# Patient Record
Sex: Female | Born: 1975 | Race: White | Hispanic: No | Marital: Single | Smoking: Never smoker
Health system: Southern US, Community
[De-identification: ages and names within clinical notes are randomized; demographics above are authoritative.]

## PROBLEM LIST (undated history)

## (undated) DIAGNOSIS — Z789 Other specified health status: Secondary | ICD-10-CM

## (undated) HISTORY — PX: DILATION AND CURETTAGE OF UTERUS: SHX78

## (undated) HISTORY — PX: WISDOM TOOTH EXTRACTION: SHX21

## (undated) HISTORY — DX: Other specified health status: Z78.9

---

## 2007-06-23 ENCOUNTER — Ambulatory Visit (HOSPITAL_COMMUNITY): Admission: RE | Admit: 2007-06-23 | Discharge: 2007-06-23 | Payer: Self-pay | Admitting: *Deleted

## 2016-05-12 ENCOUNTER — Encounter (HOSPITAL_COMMUNITY): Payer: Self-pay | Admitting: *Deleted

## 2016-05-12 ENCOUNTER — Emergency Department (HOSPITAL_COMMUNITY): Payer: BLUE CROSS/BLUE SHIELD

## 2016-05-12 ENCOUNTER — Emergency Department (HOSPITAL_COMMUNITY)
Admission: EM | Admit: 2016-05-12 | Discharge: 2016-05-13 | Disposition: A | Discharge status: Home / Self Care | Payer: BLUE CROSS/BLUE SHIELD | Attending: Emergency Medicine | Admitting: Emergency Medicine

## 2016-05-12 DIAGNOSIS — M545 Low back pain, unspecified: Secondary | ICD-10-CM

## 2016-05-12 DIAGNOSIS — K5900 Constipation, unspecified: Secondary | ICD-10-CM | POA: Diagnosis present

## 2016-05-12 LAB — CBC WITH DIFFERENTIAL/PLATELET
BASOS PCT: 1 %
Basophils Absolute: 0 10*3/uL (ref 0.0–0.1)
EOS PCT: 4 %
Eosinophils Absolute: 0.3 10*3/uL (ref 0.0–0.7)
HEMATOCRIT: 37.7 % (ref 36.0–46.0)
Hemoglobin: 13.3 g/dL (ref 12.0–15.0)
Lymphocytes Relative: 33 %
Lymphs Abs: 1.9 10*3/uL (ref 0.7–4.0)
MCH: 32 pg (ref 26.0–34.0)
MCHC: 35.3 g/dL (ref 30.0–36.0)
MCV: 90.6 fL (ref 78.0–100.0)
MONO ABS: 0.6 10*3/uL (ref 0.1–1.0)
MONOS PCT: 10 %
NEUTROS ABS: 3.1 10*3/uL (ref 1.7–7.7)
Neutrophils Relative %: 52 %
PLATELETS: 137 10*3/uL — AB (ref 150–400)
RBC: 4.16 MIL/uL (ref 3.87–5.11)
RDW: 12.3 % (ref 11.5–15.5)
WBC: 5.9 10*3/uL (ref 4.0–10.5)

## 2016-05-12 NOTE — ED Triage Notes (Signed)
Pt reports having rectal pain for "months." Pt states she has been constipated for a month and has used several enemas and when she uses them she states bright red blood comes out with a small amount of stool. Pt also reports "itching" around her rectum.

## 2016-05-12 NOTE — ED Provider Notes (Signed)
[facility] DEPT Provider Note   CSN: [id number] Arrival date & time: 05/12/16  2230  By signing my name below, I, Modena Jansky, attest that this documentation has been prepared under the direction and in the presence of Gilda Crease, MD. Electronically Signed: Modena Jansky, Scribe. 05/12/2016. 11:09 PM.  History   Chief Complaint Chief Complaint  Patient presents with  . Constipation   The history is provided by the patient and the spouse. No language interpreter was used.   HPI Comments: Janet Bray is a 41 y.o. female who presents to the Emergency Department complaining of constipation that started about a month ago. She has been using enemas without any relief and had rectal bleeding shortly after one dose. No other episodes of bleeding. She reports associated lower back pain, rectal pain with itching, and abdominal distension. She describes the pain as constant, moderate, and exacerbated by movement. She has tried diet changes in the past with relief. She denies any other complaints.     PCP: [facility]  History reviewed. No pertinent past medical history.  There are no active problems to display for this patient.   Past Surgical History:  Procedure Laterality Date  . CESAREAN SECTION    . DILATION AND CURETTAGE OF UTERUS    . WISDOM TOOTH EXTRACTION      OB History    No data available       Home Medications    Prior to Admission medications   Medication Sig Start Date End Date Taking? Authorizing Provider  fluconazole (DIFLUCAN) 150 MG tablet Take 150 mg by mouth daily.   Yes Historical Provider, MD  cyclobenzaprine (FLEXERIL) 10 MG tablet Take 1 tablet (10 mg total) by mouth 3 (three) times daily as needed for muscle spasms. 05/13/16   Gilda Crease, MD  polyethylene glycol (MIRALAX / GLYCOLAX) packet Take 17 g by mouth 2 (two) times daily. 05/13/16   Gilda Crease, MD    Family History History reviewed. No  pertinent family history.  Social History Social History  Substance Use Topics  . Smoking status: Never Smoker  . Smokeless tobacco: Never Used  . Alcohol use No     Allergies   Ciprofloxacin   Review of Systems Review of Systems  Gastrointestinal: Positive for abdominal distention, blood in stool, constipation and rectal pain. Anal bleeding: only after enema.  Musculoskeletal: Positive for back pain.  All other systems reviewed and are negative.    Physical Exam Updated Vital Signs BP 120/67 (BP Location: Right Arm)   Pulse 79   Temp 98.9 F (37.2 C) (Oral)   Resp 18   Ht 5\' 1"  (1.549 m)   Wt 104 lb (47.2 kg)   LMP 04/12/2016   SpO2 100%   BMI 19.65 kg/m   Physical Exam  Constitutional: She is oriented to person, place, and time. She appears well-developed and well-nourished. No distress.  HENT:  Head: Normocephalic and atraumatic.  Right Ear: Hearing normal.  Left Ear: Hearing normal.  Nose: Nose normal.  Mouth/Throat: Oropharynx is clear and moist and mucous membranes are normal.  Eyes: Conjunctivae and EOM are normal. Pupils are equal, round, and reactive to light.  Neck: Normal range of motion. Neck supple.  Cardiovascular: Regular rhythm, S1 normal and S2 normal.  Exam reveals no gallop and no friction rub.   No murmur heard. Pulmonary/Chest: Effort normal and breath sounds normal. No respiratory distress. She exhibits no tenderness.  Abdominal: Soft. Normal appearance and bowel  sounds are normal. There is no hepatosplenomegaly. There is no tenderness. There is no rebound, no guarding, no tenderness at McBurney's point and negative Murphy's sign. No hernia.  Musculoskeletal: Normal range of motion.  Neurological: She is alert and oriented to person, place, and time. She has normal strength. No cranial nerve deficit or sensory deficit. Coordination normal. GCS eye subscore is 4. GCS verbal subscore is 5. GCS motor subscore is 6.  Skin: Skin is warm, dry and  intact. No rash noted. No cyanosis.  Psychiatric: She has a normal mood and affect. Her speech is normal and behavior is normal. Thought content normal.  Nursing note and vitals reviewed.    ED Treatments / Results  DIAGNOSTIC STUDIES: Oxygen Saturation is 100% on RA, normal by my interpretation.    COORDINATION OF CARE: 11:13 PM- Pt advised of plan for treatment and pt agrees.  Labs (all labs ordered are listed, but only abnormal results are displayed) Labs Reviewed  CBC WITH DIFFERENTIAL/PLATELET - Abnormal; Notable for the following:       Result Value   Platelets 137 (*)    All other components within normal limits  COMPREHENSIVE METABOLIC PANEL - Abnormal; Notable for the following:    Glucose, Bld 108 (*)    All other components within normal limits  URINALYSIS, ROUTINE W REFLEX MICROSCOPIC - Abnormal; Notable for the following:    Hgb urine dipstick SMALL (*)    Squamous Epithelial / LPF 0-5 (*)    All other components within normal limits  LIPASE, BLOOD  POC URINE PREG, ED    EKG  EKG Interpretation None       Radiology Dg Abd Acute W/chest  Result Date: 05/13/2016 CLINICAL DATA:  Acute onset of lower back pain and rectal pain. Abdominal distention. Initial encounter. EXAM: DG ABDOMEN ACUTE W/ 1V CHEST COMPARISON:  None. FINDINGS: The lungs are hyperexpanded. There is no evidence of focal opacification, pleural effusion or pneumothorax. The cardiomediastinal silhouette is within normal limits. The visualized bowel gas pattern is unremarkable. Scattered stool and air are seen within the colon; there is no evidence of small bowel dilatation to suggest obstruction. No free intra-abdominal air is identified on the provided upright view. No acute osseous abnormalities are seen; the sacroiliac joints are unremarkable in appearance. IMPRESSION: 1. Unremarkable bowel gas pattern; no free intra-abdominal air seen. Moderate amount of stool noted in the colon, raising question  for mild constipation. 2. Lungs hyperexpanded but grossly clear. Electronically Signed   By: Roanna Raider M.D.   On: 05/13/2016 00:39    Procedures Procedures (including critical care time)  Medications Ordered in ED Medications - No data to display   Initial Impression / Assessment and Plan / ED Course  I have reviewed the triage vital signs and the nursing notes.  Pertinent labs & imaging results that were available during my care of the patient were reviewed by me and considered in my medical decision making (see chart for details).     Patient presents to the emergency department with complaints of back pain and constipation. She reports that she has not been having normal bowel movements for several weeks. Over this period of time she has also noticed pain in the lower back. She has attributed the back pain to constipation. Pain is related to movement. She has noticed bending, twisting, squatting down causes the pain to significantly worsened. There is no injury. She has not noticed any radiation of the pain to the lower extremities. No  saddle anesthesia, urinary symptoms. Normal strength and sensation on examination. Patient has a benign, nontender abdominal exam. Vital signs are normal. Blood work is entirely unremarkable. Urinalysis unremarkable. Abdominal x-ray does suggest constipation, no evidence of obstruction or other abnormality. Patient reassured, will treat for musculoskeletal back pain as well as constipation, likely 2 separate issues. Follow-up with primary care doctor.  Final Clinical Impressions(s) / ED Diagnoses   Final diagnoses:  Constipation, unspecified constipation type  Acute bilateral low back pain without sciatica    New Prescriptions New Prescriptions   CYCLOBENZAPRINE (FLEXERIL) 10 MG TABLET    Take 1 tablet (10 mg total) by mouth 3 (three) times daily as needed for muscle spasms.   POLYETHYLENE GLYCOL (MIRALAX / GLYCOLAX) PACKET    Take 17 g by mouth 2  (two) times daily.   I personally performed the services described in this documentation, which was scribed in my presence. The recorded information has been reviewed and is accurate.     Gilda Creasehristopher J Lam Bjorklund, MD 05/13/16 [id number]

## 2016-05-13 LAB — COMPREHENSIVE METABOLIC PANEL
ALBUMIN: 4.5 g/dL (ref 3.5–5.0)
ALK PHOS: 38 U/L (ref 38–126)
ALT: 14 U/L (ref 14–54)
ANION GAP: 7 (ref 5–15)
AST: 17 U/L (ref 15–41)
BILIRUBIN TOTAL: 0.4 mg/dL (ref 0.3–1.2)
BUN: 13 mg/dL (ref 6–20)
CALCIUM: 9 mg/dL (ref 8.9–10.3)
CO2: 26 mmol/L (ref 22–32)
CREATININE: 0.65 mg/dL (ref 0.44–1.00)
Chloride: 104 mmol/L (ref 101–111)
GFR calc Af Amer: >60 mL/min (ref >60)
GFR calc non Af Amer: >60 mL/min (ref >60)
GLUCOSE: 108 mg/dL — AB (ref 65–99)
Potassium: 3.8 mmol/L (ref 3.5–5.1)
SODIUM: 137 mmol/L (ref 135–145)
TOTAL PROTEIN: 7.6 g/dL (ref 6.5–8.1)

## 2016-05-13 LAB — URINALYSIS, ROUTINE W REFLEX MICROSCOPIC
BACTERIA UA: NONE SEEN
Bilirubin Urine: NEGATIVE
Glucose, UA: NEGATIVE mg/dL
Ketones, ur: NEGATIVE mg/dL
Leukocytes, UA: NEGATIVE
Nitrite: NEGATIVE
PROTEIN: NEGATIVE mg/dL
SPECIFIC GRAVITY, URINE: 1.015 (ref 1.005–1.030)
pH: 5 (ref 5.0–8.0)

## 2016-05-13 LAB — LIPASE, BLOOD: Lipase: 24 U/L (ref 11–51)

## 2016-05-13 LAB — POC URINE PREG, ED: PREG TEST UR: NEGATIVE

## 2016-05-13 MED ORDER — CYCLOBENZAPRINE HCL 10 MG PO TABS: 10.0000 mg | ORAL_TABLET | Freq: Three times a day (TID) | ORAL | 0 refills | Status: DC | PRN

## 2016-05-13 MED ORDER — MAGNESIUM CITRATE PO SOLN
1.0000 | Freq: Once | ORAL | Status: AC
  Administered 2016-05-13: 1 via ORAL
  Filled 2016-05-13: qty 296

## 2016-05-13 MED ORDER — POLYETHYLENE GLYCOL 3350 17 G PO PACK: 17.0000 g | PACK | Freq: Two times a day (BID) | ORAL | 0 refills | Status: DC

## 2016-05-20 ENCOUNTER — Encounter: Payer: Self-pay | Admitting: Gastroenterology

## 2016-06-17 ENCOUNTER — Encounter: Payer: Self-pay | Admitting: Gastroenterology

## 2016-06-17 ENCOUNTER — Ambulatory Visit (INDEPENDENT_AMBULATORY_CARE_PROVIDER_SITE_OTHER): Payer: BLUE CROSS/BLUE SHIELD | Admitting: Gastroenterology

## 2016-06-17 DIAGNOSIS — K5900 Constipation, unspecified: Secondary | ICD-10-CM | POA: Diagnosis not present

## 2016-06-17 LAB — CBC WITH DIFFERENTIAL/PLATELET
BASOS PCT: 1 %
Basophils Absolute: 34 cells/uL (ref 0–200)
EOS PCT: 4 %
Eosinophils Absolute: 136 cells/uL (ref 15–500)
HCT: 38.2 % (ref 35.0–45.0)
Hemoglobin: 12.8 g/dL (ref 11.7–15.5)
LYMPHS PCT: 28 %
Lymphs Abs: 952 cells/uL (ref 850–3900)
MCH: 31.3 pg (ref 27.0–33.0)
MCHC: 33.5 g/dL (ref 32.0–36.0)
MCV: 93.4 fL (ref 80.0–100.0)
MPV: 8.9 fL (ref 7.5–12.5)
Monocytes Absolute: 374 cells/uL (ref 200–950)
Monocytes Relative: 11 %
NEUTROS PCT: 56 %
Neutro Abs: 1904 cells/uL (ref 1500–7800)
PLATELETS: 134 10*3/uL — AB (ref 140–400)
RBC: 4.09 MIL/uL (ref 3.80–5.10)
RDW: 12.6 % (ref 11.0–15.0)
WBC: 3.4 10*3/uL — AB (ref 3.8–10.8)

## 2016-06-17 LAB — TSH: TSH: 1.45 m[IU]/L

## 2016-06-17 NOTE — Progress Notes (Addendum)
Primary Care Physician:  Toma Deiters, MD Primary Gastroenterologist:  Dr. Darrick Penna   Chief Complaint  Patient presents with  . Constipation    HPI:   Janet Bray is a 41 y.o. female presenting today at the request of Janet Bray ED secondary to constipation. Seen late March with normal Hgb, CMP, lipase. Abd xray with moderate amount of stool in colon. Platelets low at 137.   Had tinea versicolor and placed on a medication then started having constipation. Onset around February. Given medication in the ED that was liquid. Cleaned her completely out. Miralax is not helping at all. About every 4 days will have a BM. Lemon water helps her go only about every 4 days. Has used enema and saw blood on an occasion. No blood if going on her own. Has pain in lower back with significant constipation.   Past Medical History:  Diagnosis Date  . Medical history non-contributory     Past Surgical History:  Procedure Laterality Date  . CESAREAN SECTION    . DILATION AND CURETTAGE OF UTERUS    . WISDOM TOOTH EXTRACTION      Current Outpatient Prescriptions  Medication Sig Dispense Refill  . doxycycline (VIBRA-TABS) 100 MG tablet Take 100 mg by mouth daily. with food  1   No current facility-administered medications for this visit.     Allergies as of 06/17/2016 - Review Complete 06/17/2016  Allergen Reaction Noted  . Ciprofloxacin  05/12/2016    Family History  Problem Relation Age of Onset  . Family history unknown: Yes    Social History   Social History  . Marital status: Single    Spouse name: N/A  . Number of children: N/A  . Years of education: N/A   Occupational History  . homemaker    Social History Main Topics  . Smoking status: Never Smoker  . Smokeless tobacco: Never Used  . Alcohol use No  . Drug use: No  . Sexual activity: Not on file   Other Topics Concern  . Not on file   Social History Narrative   2 kids, 17 and 74,     Review of  Systems: Gen: Denies any fever, chills, fatigue, weight loss, lack of appetite.  CV: Denies chest pain, heart palpitations, peripheral edema, syncope.  Resp: Denies shortness of breath at rest or with exertion. Denies wheezing or cough.  GI: see HPI  GU : Denies urinary burning, urinary frequency, urinary hesitancy MS: Denies joint pain, muscle weakness, cramps, or limitation of movement.  Derm: Denies rash, itching, dry skin Psych: Denies depression, anxiety, memory loss, and confusion Heme: see HPI   Physical Exam: BP (!) 98/54   Pulse 76   Temp 97.3 F (36.3 C) (Oral)   Ht  (1.549 m)   Wt 103 lb (46.7 kg)   LMP 06/17/2016   BMI 19.46 kg/m  General:   Alert and oriented. Pleasant and cooperative. Well-nourished and well-developed.  Head:  Normocephalic and atraumatic. Eyes:  Without icterus, sclera clear and conjunctiva pink.  Ears:  Normal auditory acuity. Nose:  No deformity, discharge,  or lesions. Mouth:  No deformity or lesions, oral mucosa pink.  Lungs:  Clear to auscultation bilaterally. No wheezes, rales, or rhonchi. No distress.  Heart:  S1, S2 present without murmurs appreciated.  Abdomen:  +BS, soft, non-tender and non-distended. No HSM noted. No guarding or rebound. No masses appreciated.  Rectal:  Deferred  Msk:  Symmetrical without gross deformities.  Normal posture. Extremities:  Without  edema. Neurologic:  Alert and  oriented x4 Psych:  Alert and cooperative. Normal mood and affect.  Lab Results  Component Value Date   WBC 5.9 05/12/2016   HGB 13.3 05/12/2016   HCT 37.7 05/12/2016   MCV 90.6 05/12/2016   PLT 137 (L) 05/12/2016   Lab Results  Component Value Date   ALT 14 05/12/2016   AST 17 05/12/2016   ALKPHOS 38 05/12/2016   BILITOT 0.4 05/12/2016

## 2016-06-17 NOTE — Assessment & Plan Note (Addendum)
41 year old female with constipation, failing OTC agents. Mild scant hematochezia with enema, likely related to anorectal trauma. Otherwise, no alarm features. Trial Linzess 145 mcg once each morning, 30 minutes before breakfast. Check TSH. As of note, I see she has mild thrombocytopenia. We will recheck this today. Return in 6 weeks. If further bleeding, proceed with colonoscopy.

## 2016-06-17 NOTE — Progress Notes (Signed)
CC'ED TO PCP 

## 2016-06-17 NOTE — Patient Instructions (Signed)
For constipation: Start taking Linzess one capsule each morning, 30 minutes before breakfast. This can cause loose stool at first but continue taking for at least 5 days. If we need to adjust the dose higher or lower, we can.  Please have blood work today.  We will see you in 6 weeks!

## 2016-06-22 NOTE — Progress Notes (Signed)
TSH is normal. Her platelets remain mildly low. Is she still on doxycycline? This can cause thrombocytopenia. Whomever prescribed needs to be aware.

## 2016-06-22 NOTE — Progress Notes (Signed)
Pt is aware. Dr. Orvan FalconerBeavers, dermatologist, has her on the Doxycycline for Rosacea. She has been on it since 05/13/2016. She sees him again on 07/03/2016. I am mailing her this note and she will take to him to discuss at her appt.

## 2016-06-25 ENCOUNTER — Telehealth: Payer: Self-pay | Admitting: Gastroenterology

## 2016-06-25 NOTE — Telephone Encounter (Signed)
LMOM to call.

## 2016-06-25 NOTE — Telephone Encounter (Signed)
Pt was calling to give the nurse an update on the Linzess samples. She said that the dose she was taking wasn't helping and didn't know if she should continue or if we needed to change the dosage. Please advise and call her at [phone]

## 2016-06-25 NOTE — Telephone Encounter (Signed)
PT said the Linzess 145 mcg is not working, just giving her gas, but no results. Please advise!

## 2016-06-26 NOTE — Telephone Encounter (Signed)
Give samples of Linzess 290 mcg. Thanks! Trial of this, then call with update.

## 2016-06-29 NOTE — Telephone Encounter (Signed)
Pt is aware and samples at front for pick up, #8 Linzess 290 mcg.

## 2016-07-29 ENCOUNTER — Ambulatory Visit: Payer: BLUE CROSS/BLUE SHIELD | Admitting: Gastroenterology

## 2016-10-02 NOTE — Progress Notes (Signed)
REVIEWED-NO ADDITIONAL RECOMMENDATIONS. 

## 2017-08-20 ENCOUNTER — Telehealth: Payer: Self-pay | Admitting: Gastroenterology

## 2018-04-26 NOTE — Telephone Encounter (Signed)
Opened in error

## 2018-06-27 IMAGING — DX DG ABDOMEN ACUTE W/ 1V CHEST
4 series · 4 of 4 positions shown · non-contrast
Comparison: None.

CLINICAL DATA: Acute onset of lower back pain and rectal pain.
Abdominal distention. Initial encounter.

EXAM:
DG ABDOMEN ACUTE W/ 1V CHEST

[chest pa]
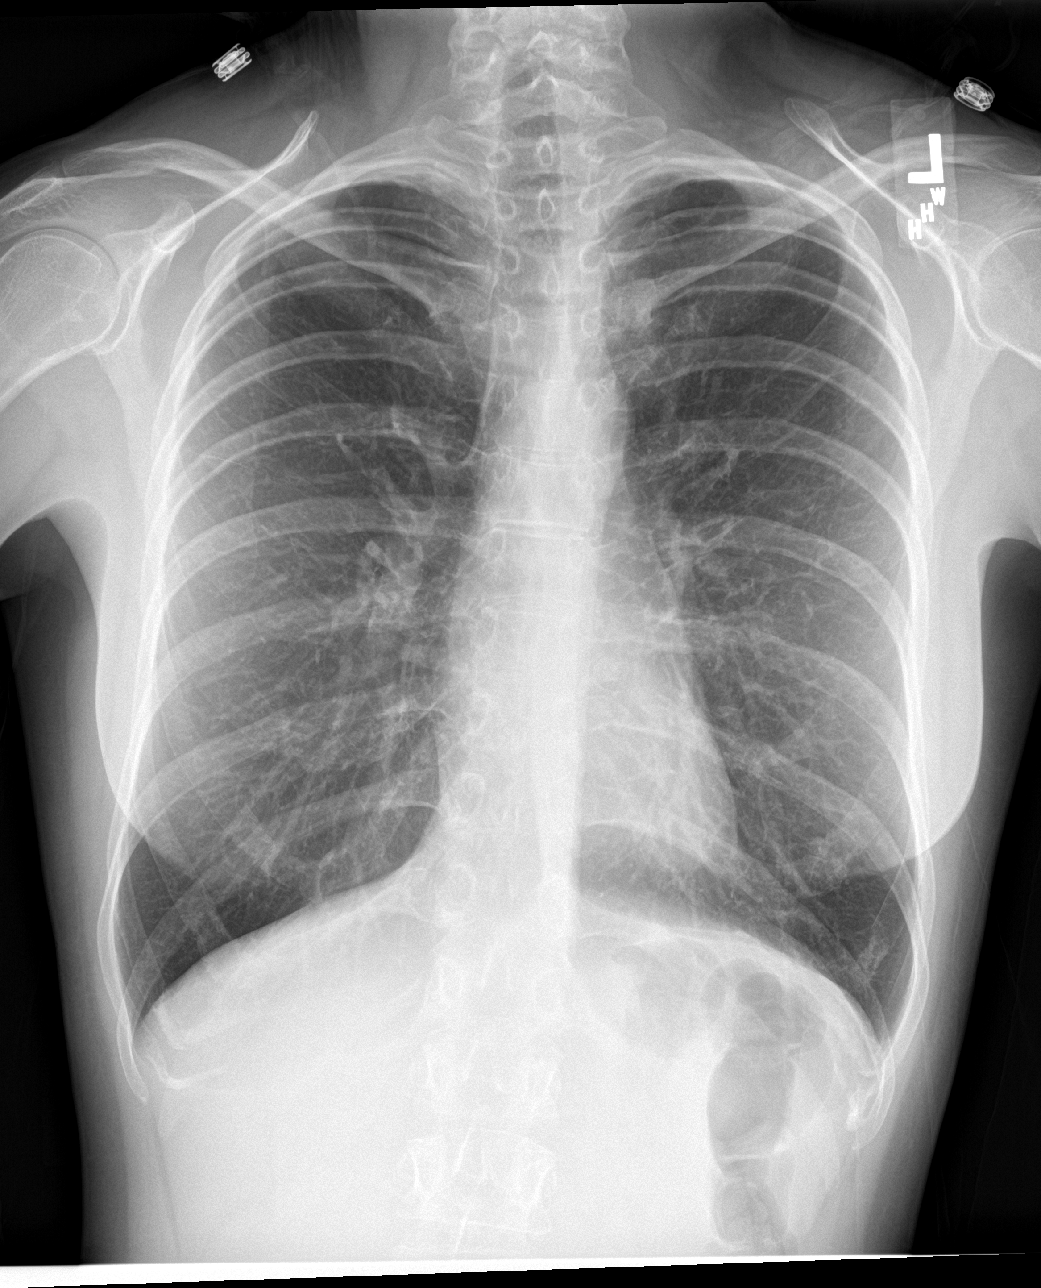

[abdomen erect]
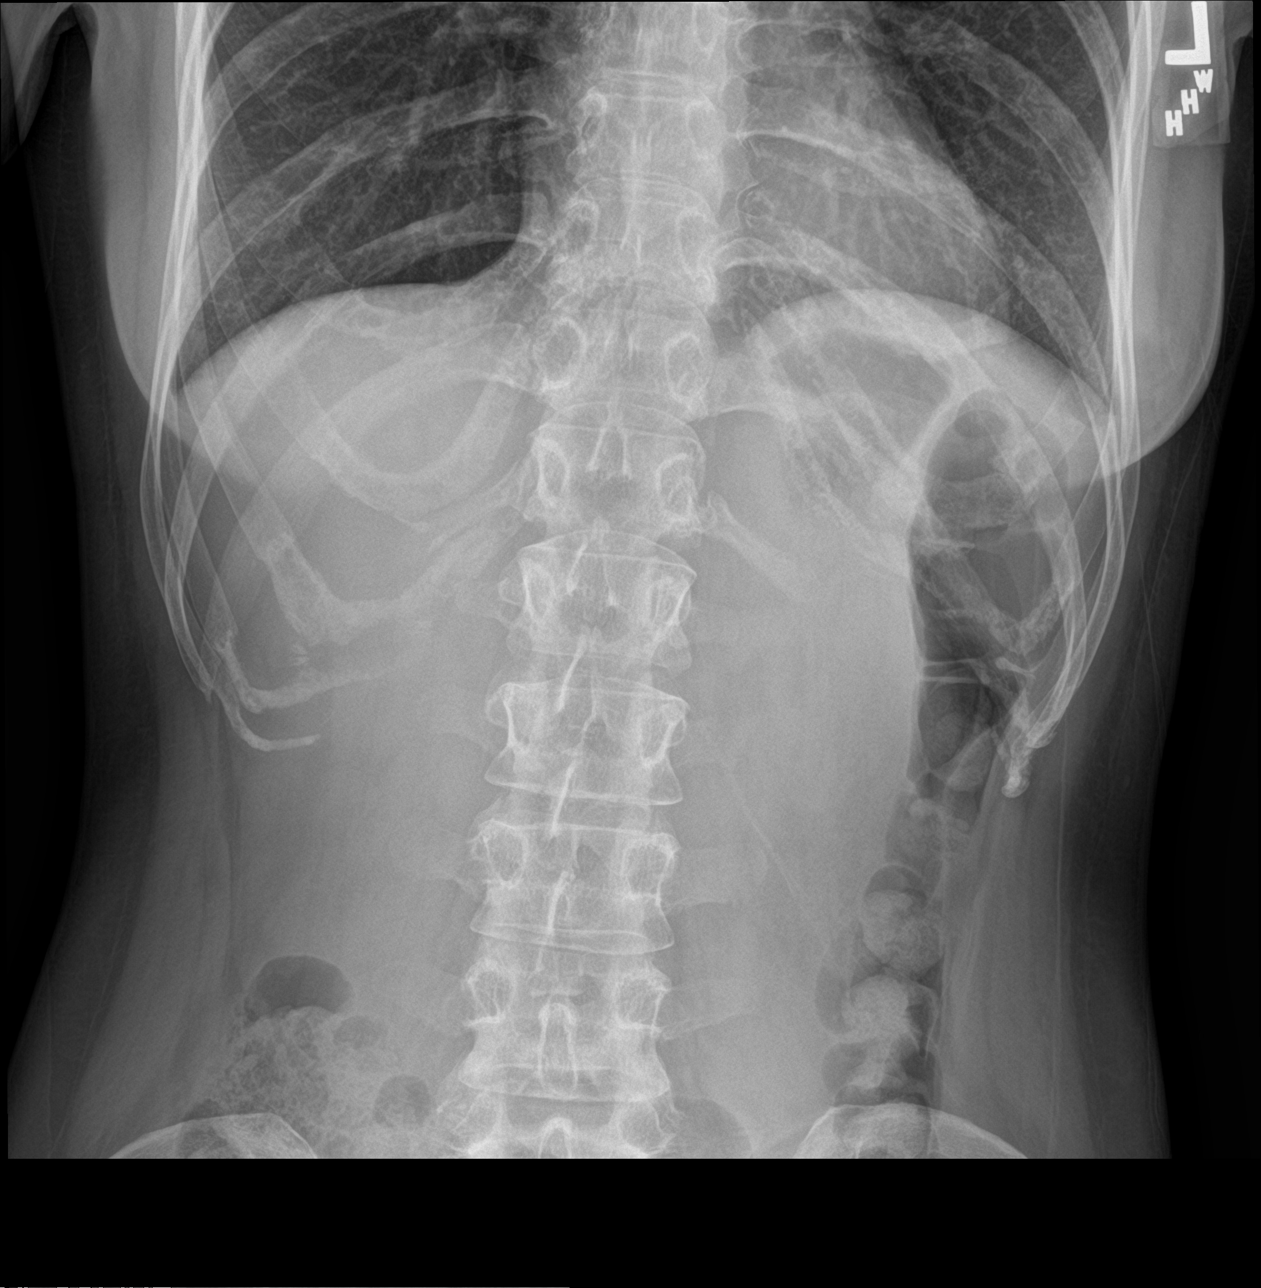

[abdomen supine (1 of 2)]
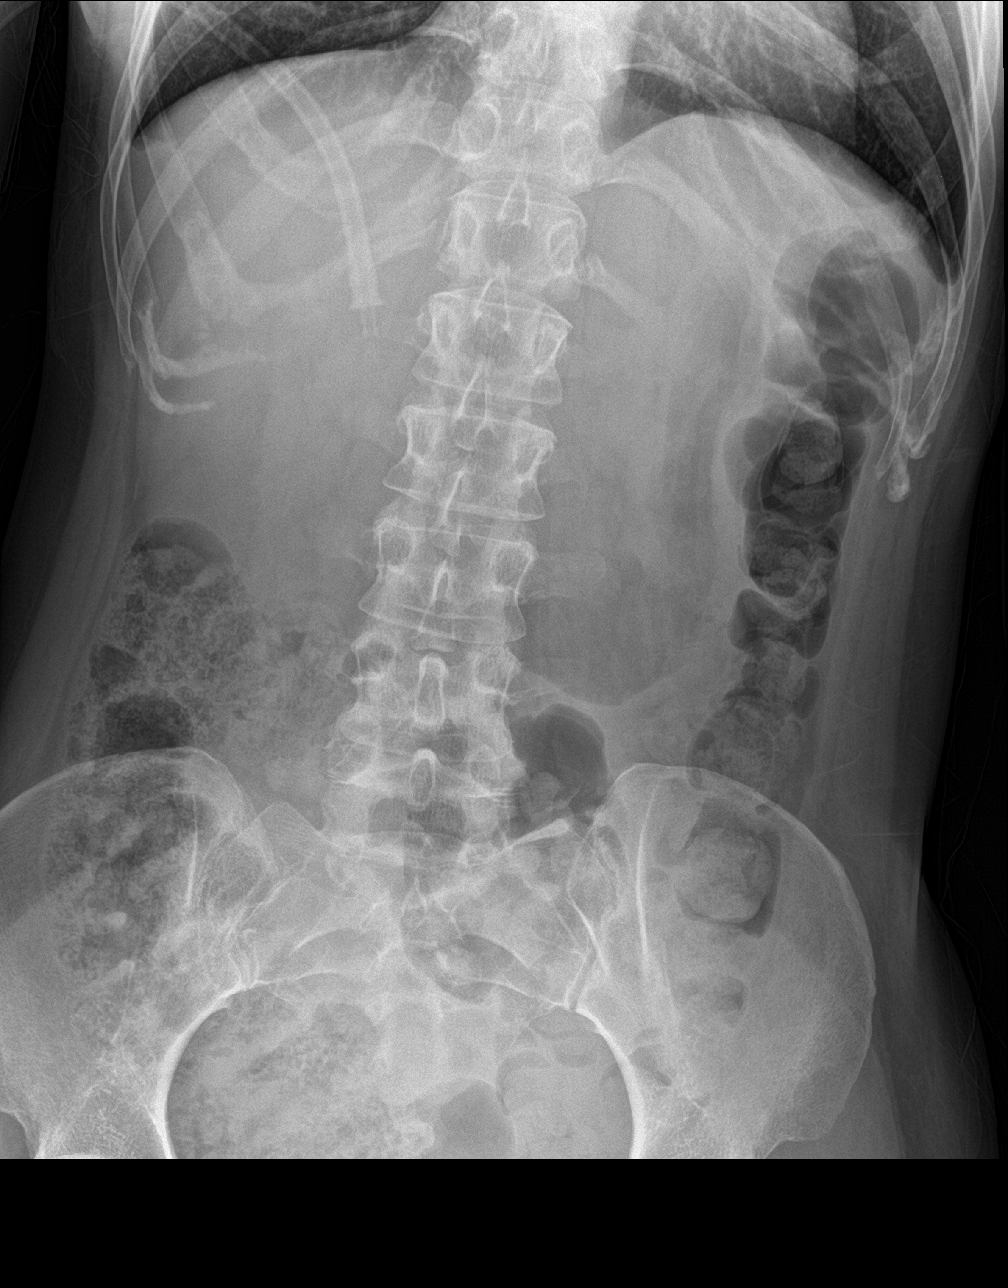

[abdomen supine (2 of 2)]
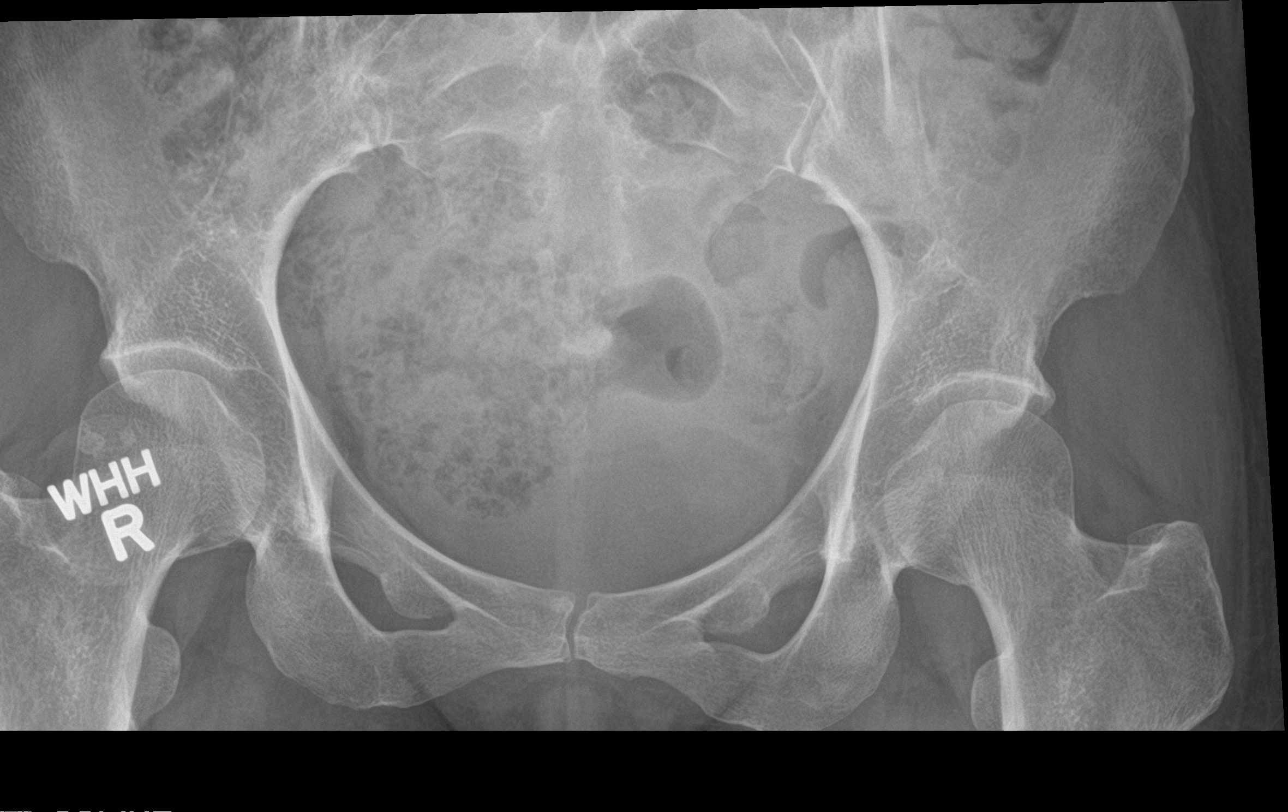

[4 of 4 positions shown; findings below may reference images not displayed]

FINDINGS: The lungs are hyperexpanded. There is no evidence of focal
opacification, pleural effusion or pneumothorax. The
cardiomediastinal silhouette is within normal limits.

The visualized bowel gas pattern is unremarkable. Scattered stool
and air are seen within the colon; there is no evidence of small
bowel dilatation to suggest obstruction. No free intra-abdominal air
is identified on the provided upright view.

No acute osseous abnormalities are seen; the sacroiliac joints are
unremarkable in appearance.
IMPRESSION: 1. Unremarkable bowel gas pattern; no free intra-abdominal air seen.
Moderate amount of stool noted in the colon, raising question for
mild constipation.
2. Lungs hyperexpanded but grossly clear.
